# Patient Record
Sex: Male | Born: 1940 | Race: White | Hispanic: No | Smoking: Former smoker
Health system: Southern US, Community
[De-identification: ages and names within clinical notes are randomized; demographics above are authoritative.]

## PROBLEM LIST (undated history)

## (undated) DIAGNOSIS — Z86711 Personal history of pulmonary embolism: Secondary | ICD-10-CM

## (undated) DIAGNOSIS — S22089A Unspecified fracture of T11-T12 vertebra, initial encounter for closed fracture: Secondary | ICD-10-CM

## (undated) DIAGNOSIS — J441 Chronic obstructive pulmonary disease with (acute) exacerbation: Secondary | ICD-10-CM

## (undated) DIAGNOSIS — R590 Localized enlarged lymph nodes: Secondary | ICD-10-CM

## (undated) DIAGNOSIS — M545 Low back pain, unspecified: Secondary | ICD-10-CM

## (undated) DIAGNOSIS — F32A Depression, unspecified: Secondary | ICD-10-CM

## (undated) DIAGNOSIS — E785 Hyperlipidemia, unspecified: Secondary | ICD-10-CM

## (undated) DIAGNOSIS — D72829 Elevated white blood cell count, unspecified: Secondary | ICD-10-CM

## (undated) DIAGNOSIS — J9600 Acute respiratory failure, unspecified whether with hypoxia or hypercapnia: Secondary | ICD-10-CM

## (undated) DIAGNOSIS — N400 Enlarged prostate without lower urinary tract symptoms: Secondary | ICD-10-CM

## (undated) DIAGNOSIS — Z720 Tobacco use: Secondary | ICD-10-CM

## (undated) DIAGNOSIS — I10 Essential (primary) hypertension: Secondary | ICD-10-CM

## (undated) DIAGNOSIS — R9389 Abnormal findings on diagnostic imaging of other specified body structures: Secondary | ICD-10-CM

## (undated) DIAGNOSIS — N289 Disorder of kidney and ureter, unspecified: Secondary | ICD-10-CM

## (undated) DIAGNOSIS — F329 Major depressive disorder, single episode, unspecified: Secondary | ICD-10-CM

## (undated) HISTORY — PX: OTHER SURGICAL HISTORY: SHX169

## (undated) HISTORY — DX: Low back pain: M54.5

## (undated) HISTORY — PX: LUNG LOBECTOMY: SHX167

## (undated) HISTORY — DX: Elevated white blood cell count, unspecified: D72.829

## (undated) HISTORY — DX: Disorder of kidney and ureter, unspecified: N28.9

## (undated) HISTORY — DX: Depression, unspecified: F32.A

## (undated) HISTORY — DX: Personal history of pulmonary embolism: Z86.711

## (undated) HISTORY — DX: Acute respiratory failure, unspecified whether with hypoxia or hypercapnia: J96.00

## (undated) HISTORY — DX: Chronic obstructive pulmonary disease with (acute) exacerbation: J44.1

## (undated) HISTORY — DX: Unspecified fracture of t11-T12 vertebra, initial encounter for closed fracture: S22.089A

## (undated) HISTORY — DX: Hyperlipidemia, unspecified: E78.5

## (undated) HISTORY — DX: Tobacco use: Z72.0

## (undated) HISTORY — DX: Low back pain, unspecified: M54.50

## (undated) HISTORY — DX: Essential (primary) hypertension: I10

## (undated) HISTORY — DX: Localized enlarged lymph nodes: R59.0

## (undated) HISTORY — DX: Benign prostatic hyperplasia without lower urinary tract symptoms: N40.0

## (undated) HISTORY — DX: Major depressive disorder, single episode, unspecified: F32.9

## (undated) HISTORY — DX: Abnormal findings on diagnostic imaging of other specified body structures: R93.89

---

## 2016-03-09 LAB — BASIC METABOLIC PANEL
BUN: 49 mg/dL — AB (ref 4–21)
CREATININE: 1.2 mg/dL (ref 0.6–1.3)
Glucose: 136 mg/dL
SODIUM: 135 mmol/L — AB (ref 137–147)

## 2016-03-09 LAB — CBC AND DIFFERENTIAL
HCT: 58 % — AB (ref 41–53)
Hemoglobin: 20.3 g/dL — AB (ref 13.5–17.5)
PLATELETS: 83 10*3/uL — AB (ref 150–399)
WBC: 28 10*3/mL

## 2016-03-09 LAB — PROTIME-INR: Protime: 38.7 seconds — AB (ref 10.0–13.8)

## 2016-03-09 LAB — POCT INR: INR: 4 — AB (ref 0.9–1.1)

## 2016-03-11 LAB — POCT INR: INR: 2.5 — AB (ref 0.9–1.1)

## 2016-03-11 LAB — BASIC METABOLIC PANEL
BUN: 44 mg/dL — AB (ref 4–21)
CREATININE: 1.2 mg/dL (ref 0.6–1.3)
Potassium: 4.1 mmol/L (ref 3.4–5.3)
SODIUM: 134 mmol/L — AB (ref 137–147)

## 2016-03-11 LAB — CBC AND DIFFERENTIAL
HEMATOCRIT: 56 % — AB (ref 41–53)
Hemoglobin: 19.4 g/dL — AB (ref 13.5–17.5)
Platelets: 75 10*3/uL — AB (ref 150–399)
WBC: 25.4 10^3/mL

## 2016-03-11 LAB — PROTIME-INR: Protime: 26.8 seconds — AB (ref 10.0–13.8)

## 2016-03-11 LAB — HEPATIC FUNCTION PANEL
ALK PHOS: 81 U/L (ref 25–125)
ALT: 9 U/L — AB (ref 10–40)
AST: 14 U/L (ref 14–40)
Bilirubin, Total: 0.5 mg/dL

## 2016-03-12 ENCOUNTER — Non-Acute Institutional Stay (SKILLED_NURSING_FACILITY): Payer: Medicare Other | Admitting: Internal Medicine

## 2016-03-12 ENCOUNTER — Encounter: Payer: Self-pay | Admitting: Internal Medicine

## 2016-03-12 DIAGNOSIS — Z720 Tobacco use: Secondary | ICD-10-CM | POA: Diagnosis not present

## 2016-03-12 DIAGNOSIS — N189 Chronic kidney disease, unspecified: Secondary | ICD-10-CM

## 2016-03-12 DIAGNOSIS — J9601 Acute respiratory failure with hypoxia: Secondary | ICD-10-CM

## 2016-03-12 DIAGNOSIS — J9602 Acute respiratory failure with hypercapnia: Secondary | ICD-10-CM

## 2016-03-12 DIAGNOSIS — N401 Enlarged prostate with lower urinary tract symptoms: Secondary | ICD-10-CM | POA: Diagnosis not present

## 2016-03-12 DIAGNOSIS — J441 Chronic obstructive pulmonary disease with (acute) exacerbation: Secondary | ICD-10-CM | POA: Diagnosis not present

## 2016-03-12 DIAGNOSIS — N179 Acute kidney failure, unspecified: Secondary | ICD-10-CM

## 2016-03-12 DIAGNOSIS — R590 Localized enlarged lymph nodes: Secondary | ICD-10-CM

## 2016-03-12 DIAGNOSIS — E871 Hypo-osmolality and hyponatremia: Secondary | ICD-10-CM | POA: Diagnosis not present

## 2016-03-12 DIAGNOSIS — R599 Enlarged lymph nodes, unspecified: Secondary | ICD-10-CM | POA: Diagnosis not present

## 2016-03-12 DIAGNOSIS — S22080D Wedge compression fracture of T11-T12 vertebra, subsequent encounter for fracture with routine healing: Secondary | ICD-10-CM

## 2016-03-12 DIAGNOSIS — E785 Hyperlipidemia, unspecified: Secondary | ICD-10-CM | POA: Diagnosis not present

## 2016-03-12 DIAGNOSIS — Z86711 Personal history of pulmonary embolism: Secondary | ICD-10-CM

## 2016-03-12 DIAGNOSIS — N138 Other obstructive and reflux uropathy: Secondary | ICD-10-CM

## 2016-03-12 NOTE — Progress Notes (Signed)
MRN: [id number] Name: Jon Everett  Sex: male Age: 75 y.o. DOB: 06/01/1941  PSC #:  [facility] Facility/Room: 107 P Level Of Care: SNF Provider: Margit Hanks Emergency Contacts: No emergency contact information on file.  Code Status:   Allergies: Review of patient's allergies indicates no known allergies.  Chief Complaint  Patient presents with  . New Admit To SNF    HPI: Patient is 75 y.o. male who with COPD, CRF3, tob abuse and PE who was admitted to [facility] from 4/21-5/4 for acute respiratory failure 2/2 acute COPD exacderbation. Hospital course was complicated by acute on CKD, hyponatremia and urinary retention. During hospitalization pt was aslo found to have a LUL nodule with corresponding hilar lymphadenopathy and s T11 fracture treated with a brace. Pt ias admitrted to SNF for OT/PT. While at SNF pt will be followed for tob abuse, tx with nicotine patch and welbutrin, HLD, tx with zocor and h/o PE treated with coumadin which will be actively titrated.  Past Medical History  Diagnosis Date  . Acute respiratory failure (HCC)   . T11 vertebral fracture (HCC)   . Lower back pain   . 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 Leukocytosis   . Renal insufficiency   . History of pulmonary embolism   . Abnormal CT of the chest   . Depression   . HLD (hyperlipidemia)   . Tobacco use   . Hilar lymphadenopathy   . Essential hypertension   . BPH (benign prostatic hyperplasia)     Past Surgical History  Procedure Laterality Date  . Lung lobectomy    . Lc ivc filter placement        Medication List       This list is accurate as of: 03/12/16 11:59 PM.  Always use your most recent med list.               albuterol (2.5 MG/3ML) 0.083% nebulizer solution  Commonly known as:  PROVENTIL  Take 2.5 mg by nebulization every 2 (two) hours as needed for wheezing or shortness of breath. Inhale 2.5 mg by nebulization every 6 hours.      albuterol 0.63 MG/3ML nebulizer solution  Commonly known as:  ACCUNEB  Take 1 ampule by nebulization every 6 (six) hours as needed for wheezing.     BREO ELLIPTA 100-25 MCG/INH Aepb  Generic drug:  fluticasone furoate-vilanterol  Inhale 1 puff into the lungs daily.     buPROPion 150 MG 12 hr tablet  Commonly known as:  ZYBAN  Take 150 mg by mouth daily.     carvedilol 3.125 MG tablet  Commonly known as:  COREG  Take 3.125 mg by mouth 2 (two) times daily.     furosemide 20 MG tablet  Commonly known as:  LASIX  Take 20 mg by mouth daily.     ipratropium 0.02 % nebulizer solution  Commonly known as:  ATROVENT  Take 2.5 mLs by nebulization every 6 (six) hours.     nicotine 14 mg/24hr patch  Commonly known as:  NICODERM CQ - dosed in mg/24 hours  Place 14 mg onto the skin daily.     predniSONE 10 MG tablet  Commonly known as:  DELTASONE  Take 30 mg daily for 3 days, 20 mg daily for 3 days, 10 mg daily for 3 days, 5 mg daily 3 days, and then stop.     simethicone 80 MG chewable tablet  Commonly known as:  MYLICON  Chew 80 mg by mouth every 6 (six) hours as needed for flatulence.     simvastatin 20 MG tablet  Commonly known as:  ZOCOR  Take 20 mg by mouth daily.     tamsulosin 0.4 MG Caps capsule  Commonly known as:  FLOMAX  Take 0.4 mg by mouth at bedtime.     theophylline 200 MG 12 hr tablet  Commonly known as:  THEODUR  Take 200 mg by mouth 2 (two) times daily.     traMADol 50 MG tablet  Commonly known as:  ULTRAM  Take by mouth every 6 (six) hours as needed.     warfarin 2.5 MG tablet  Commonly known as:  COUMADIN  Take 2.5 mg by mouth daily. With evening meal.        No orders of the defined types were placed in this encounter.     There is no immunization history on file for this patient.  Social History  Substance Use Topics  . Smoking status: Current Every Day Smoker -- 0.50 packs/day    Types: Cigarettes  . Smokeless tobacco: Never Used  .  Alcohol Use: No    Family history is + CAm, HD f  Review of Systems  DATA OBTAINED: from patient, nurse GENERAL:  no fevers, fatigue, appetite changes SKIN: No itching, rash or wounds EYES: No eye pain, redness, discharge EARS: No earache, tinnitus, change in hearing NOSE: No congestion, drainage or bleeding  MOUTH/THROAT: No mouth or tooth pain, No sore throat RESPIRATORY: No cough, wheezing, SOB CARDIAC: No chest pain, palpitations, lower extremity edema  GI: No abdominal pain, No N/V/D or constipation, No heartburn or reflux  GU: No dysuria, frequency or urgency, or incontinence  MUSCULOSKELETAL: No unrelieved bone/joint pain NEUROLOGIC: No headache, dizziness or focal weakness PSYCHIATRIC: No c/o anxiety or sadness   Filed Vitals:   03/12/16 1250  BP: 93/65  Pulse: 90  Temp: 97 F (36.1 C)  Resp: 20    SpO2 Readings from Last 1 Encounters:  No data found for SpO2        Physical Exam  GENERAL APPEARANCE: Alert, conversant,  No acute distress.  SKIN: No diaphoresis rash HEAD: Normocephalic, atraumatic  EYES: Conjunctiva/lids clear. Pupils round, reactive. EOMs intact.  EARS: External exam WNL, canals clear. Hearing grossly normal.  NOSE: No deformity or discharge.  MOUTH/THROAT: Lips w/o lesions  RESPIRATORY: Breathing is even, unlabored. Lung sounds are diffusely decreased   CARDIOVASCULAR: Heart RRR no murmurs, rubs or gallops. No peripheral edema.   GASTROINTESTINAL: Abdomen is soft, non-tender, not distended w/ normal bowel sounds. GENITOURINARY: Bladder non tender, not distended; foley  MUSCULOSKELETAL: No abnormal joints or musculature NEUROLOGIC:  Cranial nerves 2-12 grossly intact. Moves all extremities  PSYCHIATRIC: Mood and affect appropriate to situation, no behavioral issues  Patient Active Problem List   Diagnosis Date Noted  . 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 Acute respiratory failure with  hypoxia and hypercapnia (HCC) 03/13/2016  . Acute kidney injury superimposed on CKD (HCC) 03/13/2016  . Hyponatremia 03/13/2016  . Hilar lymphadenopathy 03/13/2016  . T11 vertebral fracture (HCC) 03/13/2016  . History of pulmonary embolus (PE) 03/13/2016  . Tobacco abuse 03/13/2016  . BPH with urinary obstruction 03/13/2016  . HLD (hyperlipidemia) 03/13/2016    CBC    Component Value Date/Time   WBC 25.4 03/11/2016   HGB 19.4* 03/11/2016   HCT 56* 03/11/2016   PLT 75* 03/11/2016  CMP     Component Value Date/Time   NA 134* 03/11/2016   K 4.1 03/11/2016   BUN 44* 03/11/2016   CREATININE 1.2 03/11/2016   AST 14 03/11/2016   ALT 9* 03/11/2016   ALKPHOS 81 03/11/2016    No results found for: HGBA1C   Patient was never admitted.  Not all labs, radiology exams or other studies done during hospitalization come through on my EPIC note; however they are reviewed by me.    Assessment and Plan  Acute exacerbation of chronic obstructive pulmonary disease (COPD) (HCC) Treated with nebs,IV steroids, levaquin for 13 days; cultures have been negative and CXR is neg  Acute respiratory failure with hypoxia and hypercapnia (HCC) 2/2 COPD exacerbation; improved SNF - off O2 ; will monitor  Acute kidney injury superimposed on CKD (HCC) On Stage 3 CKD - Cr returned to baseline 1.2  SNF - will f/u BMP  Hyponatremia Returned to 135; SNF - will monitor with BMP  Hilar lymphadenopathy With L pulmonary nodule on chest CTA; needs f/u outpr PET scan;  SNF - f/u pulm in 1-2 weeks  T11 vertebral fracture (HCC) TLSO brace 24/7 for 6 weeks SNF - cont brace; f/u N'surg 2 weeks  History of pulmonary embolus (PE) INR was supratheraputic; when down to 2.5  Coumadin was resumed at 2.5 mg SNF - will be actively titrating coumadin dose  Tobacco abuse SNF - pt is now wearing a nicotine patch and is on wellbutrin as well  BPH with urinary obstruction SNF pt reported had to be cathed 3  times during hospitalization;per urology foley was laced , to be removed at urology f/u  HLD (hyperlipidemia) SNF - not stated as uncontrolled ; cont zocor 20 mg   Time spent . 45 min;> 50% of time with patient was spent reviewing records, labs, tests and studies, counseling and developing plan of care  Merrilee Seashore, MD

## 2016-03-13 ENCOUNTER — Encounter: Payer: Self-pay | Admitting: Internal Medicine

## 2016-03-13 DIAGNOSIS — J9602 Acute respiratory failure with hypercapnia: Secondary | ICD-10-CM

## 2016-03-13 DIAGNOSIS — R590 Localized enlarged lymph nodes: Secondary | ICD-10-CM | POA: Insufficient documentation

## 2016-03-13 DIAGNOSIS — Z86711 Personal history of pulmonary embolism: Secondary | ICD-10-CM | POA: Insufficient documentation

## 2016-03-13 DIAGNOSIS — J9601 Acute respiratory failure with hypoxia: Secondary | ICD-10-CM | POA: Insufficient documentation

## 2016-03-13 DIAGNOSIS — N138 Other obstructive and reflux uropathy: Secondary | ICD-10-CM | POA: Insufficient documentation

## 2016-03-13 DIAGNOSIS — Z720 Tobacco use: Secondary | ICD-10-CM | POA: Insufficient documentation

## 2016-03-13 DIAGNOSIS — E785 Hyperlipidemia, unspecified: Secondary | ICD-10-CM | POA: Insufficient documentation

## 2016-03-13 DIAGNOSIS — N189 Chronic kidney disease, unspecified: Secondary | ICD-10-CM

## 2016-03-13 DIAGNOSIS — E871 Hypo-osmolality and hyponatremia: Secondary | ICD-10-CM | POA: Insufficient documentation

## 2016-03-13 DIAGNOSIS — S22089A Unspecified fracture of T11-T12 vertebra, initial encounter for closed fracture: Secondary | ICD-10-CM | POA: Insufficient documentation

## 2016-03-13 DIAGNOSIS — N401 Enlarged prostate with lower urinary tract symptoms: Secondary | ICD-10-CM

## 2016-03-13 DIAGNOSIS — N179 Acute kidney failure, unspecified: Secondary | ICD-10-CM | POA: Insufficient documentation

## 2016-03-13 DIAGNOSIS — J441 Chronic obstructive pulmonary disease with (acute) exacerbation: Secondary | ICD-10-CM | POA: Insufficient documentation

## 2016-03-13 NOTE — Assessment & Plan Note (Signed)
With L pulmonary nodule on chest CTA; needs f/u outpr PET scan;  SNF - f/u pulm in 1-2 weeks

## 2016-03-13 NOTE — Assessment & Plan Note (Signed)
Returned to 135; SNF - will monitor with BMP

## 2016-03-13 NOTE — Assessment & Plan Note (Signed)
On Stage 3 CKD - Cr returned to baseline 1.2  SNF - will f/u BMP

## 2016-03-13 NOTE — Assessment & Plan Note (Signed)
INR was supratheraputic; when down to 2.5  Coumadin was resumed at 2.5 mg SNF - will be actively titrating coumadin dose

## 2016-03-13 NOTE — Assessment & Plan Note (Signed)
Treated with nebs,IV steroids, levaquin for 13 days; cultures have been negative and CXR is neg

## 2016-03-13 NOTE — Assessment & Plan Note (Signed)
SNF - not stated as uncontrolled ; cont zocor 20 mg

## 2016-03-13 NOTE — Assessment & Plan Note (Signed)
2/2 COPD exacerbation; improved SNF - off O2 ; will monitor

## 2016-03-13 NOTE — Assessment & Plan Note (Signed)
SNF pt reported had to be cathed 3 times during hospitalization;per urology foley was laced , to be removed at urology f/u

## 2016-03-13 NOTE — Assessment & Plan Note (Signed)
SNF - pt is now wearing a nicotine patch and is on wellbutrin as well

## 2016-03-13 NOTE — Assessment & Plan Note (Signed)
TLSO brace 24/7 for 6 weeks SNF - cont brace; f/u N'surg 2 weeks

## 2016-03-15 LAB — BASIC METABOLIC PANEL
BUN: 34 mg/dL — AB (ref 4–21)
CREATININE: 1.2 mg/dL (ref <=1.3)
Glucose: 120 mg/dL
POTASSIUM: 4.4 mmol/L (ref 3.4–5.3)
Sodium: 128 mmol/L — AB (ref 137–147)

## 2016-03-15 LAB — CBC AND DIFFERENTIAL
HCT: 54 % — AB (ref 41–53)
Hemoglobin: 17.9 g/dL — AB (ref 13.5–17.5)
Platelets: 95 10*3/uL — AB (ref 150–399)
WBC: 27.2 10^3/mL

## 2016-03-17 ENCOUNTER — Encounter: Payer: Self-pay | Admitting: Internal Medicine

## 2016-03-17 ENCOUNTER — Non-Acute Institutional Stay (SKILLED_NURSING_FACILITY): Payer: Medicare Other | Admitting: Internal Medicine

## 2016-03-17 DIAGNOSIS — E871 Hypo-osmolality and hyponatremia: Secondary | ICD-10-CM | POA: Diagnosis not present

## 2016-03-17 DIAGNOSIS — D72829 Elevated white blood cell count, unspecified: Secondary | ICD-10-CM

## 2016-03-17 DIAGNOSIS — D696 Thrombocytopenia, unspecified: Secondary | ICD-10-CM

## 2016-03-17 NOTE — Progress Notes (Signed)
MRN: [id number] Name: Jon Everett  Sex: male Age: 75 y.o. DOB: [birth year]  PSC #: [facility] Facility/Room: 107 - P Level Of Care: SNF Provider: Merrilee Seashore MD Emergency Contacts:Almanza, Renae Fickle (Son)  [phone] Code Status: FullCode   Allergies: Review of patient's allergies indicates no known allergies.  Chief Complaint  Patient presents with  . Acute Visit    HPI: Patient is 75 y.o. male who is being seen today acutely for abnormal lab results, discussed below. Pt feels fine , has no c/o, no cough ,cold CP , no more SOB than usual, has foley, no discomfort. Pt says he doesn't think he drink much fluid BUT also comments he can't believe how much urine comes out of him. Also told me he drank 3 pitchers of water the first night he was at [facility] when he had no TV.  Past Medical History  Diagnosis Date  . Acute respiratory failure (HCC)   . T11 vertebral fracture (HCC)   . Lower back pain   . 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 Leukocytosis   . Renal insufficiency   . History of pulmonary embolism   . Abnormal CT of the chest   . Depression   . HLD (hyperlipidemia)   . Tobacco use   . Hilar lymphadenopathy   . Essential hypertension   . BPH (benign prostatic hyperplasia)     Past Surgical History  Procedure Laterality Date  . Lung lobectomy    . Lc ivc filter placement        Medication List       This list is accurate as of: 03/17/16  9:20 PM.  Always use your most recent med list.               albuterol (2.5 MG/3ML) 0.083% nebulizer solution  Commonly known as:  PROVENTIL  Take 2.5 mg by nebulization every 2 (two) hours as needed for wheezing or shortness of breath. Inhale 2.5 mg by nebulization every 6 hours.     albuterol 0.63 MG/3ML nebulizer solution  Commonly known as:  ACCUNEB  Take 1 ampule by nebulization every 6 (six) hours as needed for wheezing.     BREO ELLIPTA 100-25 MCG/INH Aepb  Generic drug:   fluticasone furoate-vilanterol  Inhale 1 puff into the lungs daily.     buPROPion 150 MG 12 hr tablet  Commonly known as:  ZYBAN  Take 150 mg by mouth daily.     carvedilol 3.125 MG tablet  Commonly known as:  COREG  Take 3.125 mg by mouth 2 (two) times daily.     furosemide 20 MG tablet  Commonly known as:  LASIX  Take 20 mg by mouth daily.     ipratropium 0.02 % nebulizer solution  Commonly known as:  ATROVENT  Take 2.5 mLs by nebulization every 6 (six) hours.     nicotine 14 mg/24hr patch  Commonly known as:  NICODERM CQ - dosed in mg/24 hours  Place 14 mg onto the skin daily.     predniSONE 10 MG tablet  Commonly known as:  DELTASONE  Take 30 mg daily for 3 days, 20 mg daily for 3 days, 10 mg daily for 3 days, 5 mg daily 3 days, and then stop.     simethicone 80 MG chewable tablet  Commonly known as:  MYLICON  Chew 80 mg by mouth every 6 (six) hours as needed for flatulence.     simvastatin 20 MG tablet  Commonly known as:  ZOCOR  Take 20 mg by mouth daily.     tamsulosin 0.4 MG Caps capsule  Commonly known as:  FLOMAX  Take 0.4 mg by mouth at bedtime.     theophylline 200 MG 12 hr tablet  Commonly known as:  THEODUR  Take 200 mg by mouth 2 (two) times daily.     traMADol 50 MG tablet  Commonly known as:  ULTRAM  Take by mouth every 6 (six) hours as needed.     warfarin 2.5 MG tablet  Commonly known as:  COUMADIN  Take 2.5 mg by mouth daily. With evening meal.        No orders of the defined types were placed in this encounter.     There is no immunization history on file for this patient.  Social History  Substance Use Topics  . Smoking status: Former Smoker -- 0.50 packs/day    Types: Cigarettes  . Smokeless tobacco: Never Used  . Alcohol Use: No    Review of Systems  DATA OBTAINED: from patient GENERAL:  no fevers, fatigue, appetite changes SKIN: No itching, rash HEENT: No complaint RESPIRATORY: No cough, wheezing, no more SOB than  usual CARDIAC: No chest pain, palpitations, lower extremity edema  GI: No abdominal pain, No N/V/D or constipation, No heartburn or reflux  GU: No dysuria, frequency or urgency, or incontinence  MUSCULOSKELETAL: back pain is better NEUROLOGIC: No headache, dizziness  PSYCHIATRIC: No overt anxiety or sadness  Filed Vitals:   03/17/16 1100  BP: 94/68  Pulse: 78  Temp: 97.9 F (36.6 C)  Resp: 20    Physical Exam  GENERAL APPEARANCE: Alert, conversant, No acute distress  SKIN: No diaphoresis rash HEENT: Unremarkable RESPIRATORY: Breathing is even, unlabored. Lung sounds are with rhonch/squeaks L upper chest; wearing O2   CARDIOVASCULAR: Heart RRR no murmurs, rubs or gallops. No peripheral edema  GASTROINTESTINAL: Abdomen is soft, non-tender, not distended w/ normal bowel sounds.  GENITOURINARY: Bladder non tender, not distended  MUSCULOSKELETAL: No abnormal joints or musculature NEUROLOGIC: Cranial nerves 2-12 grossly intact. Moves all extremities PSYCHIATRIC: Mood and affect appropriate to situation, no behavioral issues  Patient Active Problem List   Diagnosis Date Noted  . 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 Acute respiratory failure with hypoxia and hypercapnia (HCC) 03/13/2016  . Acute kidney injury superimposed on CKD (HCC) 03/13/2016  . Hyponatremia 03/13/2016  . Hilar lymphadenopathy 03/13/2016  . T11 vertebral fracture (HCC) 03/13/2016  . History of pulmonary embolus (PE) 03/13/2016  . Tobacco abuse 03/13/2016  . BPH with urinary obstruction 03/13/2016  . HLD (hyperlipidemia) 03/13/2016    CBC    Component Value Date/Time   WBC 27.2 03/15/2016   HGB 17.9* 03/15/2016   HCT 54* 03/15/2016   PLT 95* 03/15/2016    CMP     Component Value Date/Time   NA 128* 03/15/2016   K 4.4 03/15/2016   BUN 34* 03/15/2016   CREATININE 1.2 03/15/2016   AST 14 03/11/2016   ALT 9* 03/11/2016   ALKPHOS 81 03/11/2016     Assessment and Plan  LEUKOCYTOSIS - pt's WBC was 27.2 - review [facility], no source of infection found; will check pt's urine but I do not think this pt has an infection, in every way he is doing better than prior.  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   Time spent > 35 min;> 50% of time with patient was spent reviewing records, labs, tests and studies, counseling and developing plan of care Merrilee Seashore  MD

## 2016-03-18 ENCOUNTER — Encounter: Payer: Self-pay | Admitting: Internal Medicine

## 2016-03-18 ENCOUNTER — Encounter (SKILLED_NURSING_FACILITY): Payer: Medicare Other | Admitting: Internal Medicine

## 2016-03-18 DIAGNOSIS — D72829 Elevated white blood cell count, unspecified: Secondary | ICD-10-CM | POA: Diagnosis not present

## 2016-03-18 DIAGNOSIS — R319 Hematuria, unspecified: Secondary | ICD-10-CM

## 2016-03-18 NOTE — Progress Notes (Signed)
Patient ID: Jon Everett, male   DOB: 05-Sep-1941, 75 y.o.   MRN: [id number]  Location:  [facility] Room Number: 107/P Place of Service:  SNF [phone]) Provider:  Edmon Crape  No primary care provider on file.  No care team member to display  No emergency contact information on file.  Code Status:  107/P Goals of care: Advanced Directive information Advanced Directives 03/18/2016  Does patient have an advance directive? Yes  Does patient want to make changes to advanced directive? No - Patient declined  Copy of advanced directive(s) in chart? Yes  Would patient like information on creating an advanced directive? -    Chief complaint-acute visit secondary to leukocytosis-hematuria   HPI:  Pt is a 75 y.o. male seen today for an acute visit for follow-up leukocytosis-patient also has developed hematuria.  Patient has a chronic indwelling Foley catheter that is draining some bloody urine apparently there there is a bit less later today than it was this morning.  He denies any fever or chills a urinalysis and culture is pending which was drawn yesterday patient does have a history of leukocytosis and appears this was somewhat persisted in the hospital running often in the 20,000+ range-most recent labs showed white count of 27,200 on May 8.  He continues to deny any shortness of breath chest congestion cough fever or chills.  Patient was hospitalized for acute respiratory failure-he also had hyponatremia also discovered to have a left upper lung nodule.  In regards to hyponatremia Dr. Lyn Hollingshead has seen him and put him on fluid restrictions 1500 mL a day-updated BMP is pending for May 15.  Clinically he appears stable as noted above vital signs are stable        Past Medical History  Diagnosis Date  . Acute respiratory failure (HCC)   . T11 vertebral fracture (HCC)   . Lower back pain   . 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 Leukocytosis   . Renal insufficiency   . History of pulmonary embolism   . Abnormal CT of the chest   . Depression   . HLD (hyperlipidemia)   . Tobacco use   . Hilar lymphadenopathy   . Essential hypertension   . BPH (benign prostatic hyperplasia)    Past Surgical History  Procedure Laterality Date  . Lung lobectomy    . Lc ivc filter placement      No Known Allergies    Medication List       This list is accurate as of: 03/18/16  1:44 PM.  Always use your most recent med list.               albuterol (2.5 MG/3ML) 0.083% nebulizer solution  Commonly known as:  PROVENTIL  Take 2.5 mg by nebulization every 2 (two) hours as needed for wheezing or shortness of breath.     albuterol 0.63 MG/3ML nebulizer solution  Commonly known as:  ACCUNEB  Take 1 ampule by nebulization every 6 (six) hours as needed for wheezing.     BREO ELLIPTA 100-25 MCG/INH Aepb  Generic drug:  fluticasone furoate-vilanterol  Inhale 1 puff once daily for COPD rinse mouth after meds and spit out     buPROPion 150 MG 12 hr tablet  Commonly known as:  ZYBAN  Take 150 mg by mouth daily. For Depression     carvedilol 3.125 MG tablet  Commonly known as:  COREG  Take 3.125  mg by mouth 2 (two) times daily. For HTN     furosemide 20 MG tablet  Commonly known as:  LASIX  Take 20 mg by mouth daily. For HTN     ipratropium 0.02 % nebulizer solution  Commonly known as:  ATROVENT  Take 2.5 mLs by nebulization every 6 (six) hours. For COPD     nicotine 14 mg/24hr patch  Commonly known as:  NICODERM CQ - dosed in mg/24 hours  Place 1 patch on skin daily for tobacco abuse remove old patch before applying new one     predniSONE 10 MG tablet  Commonly known as:  DELTASONE  Give 10 mg by mouth for 3 days ( start date 03/18/16 end date 5/13/ 17 ) Then give 5 mg by mouth for 3 days and then stop     simethicone 80 MG chewable tablet  Commonly known as:  MYLICON  Chew 80 mg by  mouth every 6 (six) hours as needed for flatulence.     simvastatin 20 MG tablet  Commonly known as:  ZOCOR  Take 20 mg by mouth daily. For hyperlipidemia     tamsulosin 0.4 MG Caps capsule  Commonly known as:  FLOMAX  Take 0.4 mg by mouth at bedtime. For BPH     theophylline 200 MG 12 hr tablet  Commonly known as:  THEODUR  Take 200 mg by mouth 2 (two) times daily. For COPD     traMADol 50 MG tablet  Commonly known as:  ULTRAM  Take by mouth every 6 (six) hours as needed.     warfarin 2.5 MG tablet  Commonly known as:  COUMADIN  Take 2.5 mg by mouth daily. With evening meal.        Review of Systems   General no complaints of fever chills.  Skin does not complain of rashes or itching.  Head ears eyes nose mouth and throat no complaints of sore throat nasal discharge or visual changes.  Respiratory does not complain of cough or shortness of breath does have a history COPD oxygen dependent.  Rx not complaining of any chest pain.  GI and no abdominal pain nausea vomiting diarrhea or constipation noted.  GU chronic indwelling Foley catheter with history of urinary retention.  Does not really complain of dysuria however.  Muscle skeletal is not complaining of joint pain.  Neurologic is not complaining of dizziness headache or syncopal-type feelings.  Psych does not exhibit concerns about overt depression or anxiety  Immunization History  Administered Date(s) Administered  . Influenza-Unspecified 09/09/2015  . PPD Test 03/11/2016  . Pneumococcal-Unspecified 09/09/2015   Pertinent  Health Maintenance Due  Topic Date Due  . PNA vac Low Risk Adult (1 of 2 - PCV13) 08/26/2008  . COLONOSCOPY  03/18/2026 (Originally 08/26/1993)  . INFLUENZA VACCINE  06/08/2016   No flowsheet data found. Functional Status Survey:    Filed Vitals:   03/18/16 1103  BP: 102/72  Pulse: 80  Temp: 97.6 F (36.4 C)  TempSrc: Oral  Resp: 20  Height: 5\' 11"  (1.803 m)  Weight: 213  lb (96.616 kg)   Body mass index is 29.72 kg/(m^2). Physical Exam   In general this is a pleasant elderly male in no distress sitting comfortably in his chair.  His skin is warm and dry he has numerous solar induced changes.  Oropharynx clear mucous membranes moist.  Chest is clear to auscultation with severely reduced breath sounds no labored breathing.  Heart is regular rate and  rhythm without murmur gallop or rub he has minimal lower extremity edema.  Abdomen is soft nontender positive bowel sounds.  GU has a Foley catheter draining what appears to be some blood-tinged urine apparently this is [employer] and it appeared earlier today per nursing.  Musculoskeletal does move all extremities 4 with generalized weakness most prominently lower extremities.  Neurologic is grossly intact no lateralizing findings speech is clear.  Psych he is alert and oriented pleasant and appropriate  Labs reviewed:  Recent Labs  03/09/16 03/11/16 03/15/16  NA 135* 134* 128*  K  --  4.1 4.4  BUN 49* 44* 34*  CREATININE 1.2 1.2 1.2    Recent Labs  03/11/16  AST 14  ALT 9*  ALKPHOS 81    Recent Labs  03/09/16 03/11/16 03/15/16  WBC 28.0 25.4 27.2  HGB 20.3* 19.4* 17.9*  HCT 58* 56* 54*  PLT 83* 75* 95*   No results found for: TSH No results found for: HGBA1C No results found for: CHOL, HDL, LDLCALC, LDLDIRECT, TRIG, CHOLHDL  Significant Diagnostic Results in last 30 days:  No results found.   Assessment and plan.  #1 leukocytosis-he does not really show signs of sepsis in any fashion without cough congestion fever chills has hematuria a urine culture is pending we'll await those results and monitor-I did discuss this with Dr. Lyn HollingsheadAlexander via phone--ray  Also will order a hematology oncology consult for follow-up of leukocytosis-again patient does have a left upper lung nodule which may be contributing to this-.  #2 thrombocytopenia this appears relatively stable with a  platelet count 95,000 on lab May 8 with actually appears to be a slight improvement from hospital values.  #3 hematuria-as noted above a urine culture is pending  #4 hyponatremia again update lab is pending he has been put on fluid restrictions apparently had significant water intake previously per previous discussion and note I see by Dr. Lyn HollingsheadAlexander.  Clinically again he appears to be stable but will have to be monitored.  [id number]-of note greater than 25 minutes spent assessing patient-reviewing his chart-discussing status with nursing as well as with Dr. Lyn HollingsheadAlexander via phone-and coordinating and formulating a plan of care-of note greater than 50% of time spent coordinating plan of care     London SheerLuster, Sally C, [place] [phone]

## 2016-03-18 NOTE — Progress Notes (Signed)
This encounter was created in error - please disregard.  This encounter was created in error - please disregard.

## 2016-03-18 NOTE — Progress Notes (Deleted)
Patient ID: Jon LawsDonald Palazzo, male   DOB: 04-14-1941, 75 y.o.   MRN: [id number]

## 2016-03-24 LAB — BASIC METABOLIC PANEL
BUN: 20 mg/dL (ref 4–21)
Creatinine: 0.9 mg/dL (ref <=1.3)
GLUCOSE: 117 mg/dL
Potassium: 3.3 mmol/L — AB (ref 3.4–5.3)
Sodium: 130 mmol/L — AB (ref 137–147)

## 2016-03-25 ENCOUNTER — Non-Acute Institutional Stay (SKILLED_NURSING_FACILITY): Payer: Medicare Other | Admitting: Internal Medicine

## 2016-03-25 ENCOUNTER — Encounter: Payer: Self-pay | Admitting: Internal Medicine

## 2016-03-25 DIAGNOSIS — J9602 Acute respiratory failure with hypercapnia: Secondary | ICD-10-CM

## 2016-03-25 DIAGNOSIS — N138 Other obstructive and reflux uropathy: Secondary | ICD-10-CM

## 2016-03-25 DIAGNOSIS — Z86711 Personal history of pulmonary embolism: Secondary | ICD-10-CM | POA: Diagnosis not present

## 2016-03-25 DIAGNOSIS — N401 Enlarged prostate with lower urinary tract symptoms: Secondary | ICD-10-CM | POA: Diagnosis not present

## 2016-03-25 DIAGNOSIS — J9601 Acute respiratory failure with hypoxia: Secondary | ICD-10-CM

## 2016-03-25 DIAGNOSIS — N189 Chronic kidney disease, unspecified: Secondary | ICD-10-CM

## 2016-03-25 DIAGNOSIS — N179 Acute kidney failure, unspecified: Secondary | ICD-10-CM

## 2016-03-25 NOTE — Progress Notes (Signed)
Location:  [facility] Room Number: 107/P Place of Service:  SNF [phone]) Provider:  Edmon Crape  No primary care provider on file.  No care team member to display  No emergency contact information on file.  Code Status:  Full Code Goals of care: Advanced Directive information Advanced Directives 03/25/2016  Does patient have an advance directive? Yes  Does patient want to make changes to advanced directive? No - Patient declined  Copy of advanced directive(s) in chart? Yes     Chief Complaint  Patient presents with  . Discharge Note    HPI:  Pt is a 75 y.o. male seen today for For discharge-.  Patient was admitted to the hospital in late April for acute respiratory failure secondary to acute COPD exacerbation.  Hospital course was complicated by chronic kidney disease-hyponatremia and urinary retention.  He was also found to have a left upper lobe nodule with corresponding hilar lymphadenopathy and a T11 fracture that was treated with a brace.  Patient was admitted here for strengthening OT and PT he has gained strength was still will need fairly extensive PT and OT.  He is highly motivated to go home apparently wanted to go home today after he arrived-he did agree to stay for some rehabilitation and appears to have benefited fitted from this but again will need fairly aggressive therapy as an outpatient he does use a walker but still this is somewhat guarded and he needs assistance-patient assures me he does have very supportive family who will be with him constantly.  In regards to a T11 compression fracture this appears to be stable current pain medicines including tramadol-he did have an MRI this week apparently which appeared to show stability and told he did not really need follow-up.  In regards to respiratory issues this is been stable he is on chronic oxygen and is on numerous medications including Proventil albuterol nebulizers  Breo-Ellipta t-Atrovent nebulizers-he has completed a prednisone taper as well-of note he is also on theophylline 2 times a day.  Patient has been noted to have a low sodium in the high 120s Dr. Lyn Hollingshead did put him on fluid restriction since appears to have helped sodium on lab done yesterday May 17 was 1:30 renal function appear to be stable with a creatinine of 0.86 and BUN 20.  Marland Kitchen  Patient does have a history of leukocytosis with a white count of 27,200 on lab done May 8-this was also elevated hospital--infection was found in the hospital-clinically appears to be doing well there is some thought this may be related to a process involving the left upper lung nodule-hematology oncology consult has been ordered this will need follow-up as an outpatient he has been afebrile appears to be doing well in this regard again with no further sign of infection.  Of note he did have a urine culture done which did grow out 75,000 colonies of these and Dr. Lyn Hollingshead did start him on a course of Diflucan-.  Currently has no complaints is looking forward to going home vital signs of been stable.  He is on Coumadin for history of pulmonary embolism PT/INR has been scheduled for tomorrow.       Past Medical History  Diagnosis Date  . Acute respiratory failure (HCC)   . T11 vertebral fracture (HCC)   . Lower back pain   . 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 Leukocytosis   . Renal insufficiency   .  History of pulmonary embolism   . Abnormal CT of the chest   . Depression   . HLD (hyperlipidemia)   . Tobacco use   . Hilar lymphadenopathy   . Essential hypertension   . BPH (benign prostatic hyperplasia)    Past Surgical History  Procedure Laterality Date  . Lung lobectomy    . Lc ivc filter placement      No Known Allergies    Medication List       This list is accurate as of: 03/25/16  3:43 PM.  Always use your most recent med list.                BREO ELLIPTA 100-25 MCG/INH Aepb  Generic drug:  fluticasone furoate-vilanterol  Inhale 1 puff once daily for COPD rinse mouth after meds and spit out     buPROPion 150 MG 12 hr tablet  Commonly known as:  ZYBAN  Take 150 mg by mouth daily. For Depression     carvedilol 3.125 MG tablet  Commonly known as:  COREG  Take 3.125 mg by mouth 2 (two) times daily. For HTN     furosemide 20 MG tablet  Commonly known as:  LASIX  Take 20 mg by mouth daily. For HTN     ipratropium 0.02 % nebulizer solution  Commonly known as:  ATROVENT  Take 2.5 mLs by nebulization every 6 (six) hours. For COPD     nicotine 14 mg/24hr patch  Commonly known as:  NICODERM CQ - dosed in mg/24 hours  Place 1 patch on skin daily for tobacco abuse remove old patch before applying new one     simethicone 80 MG chewable tablet  Commonly known as:  MYLICON  Chew 80 mg by mouth every 6 (six) hours as needed for flatulence.     simvastatin 20 MG tablet  Commonly known as:  ZOCOR  Take 20 mg by mouth daily. For hyperlipidemia     tamsulosin 0.4 MG Caps capsule  Commonly known as:  FLOMAX  Take 0.4 mg by mouth at bedtime. For BPH     theophylline 200 MG 12 hr tablet  Commonly known as:  THEODUR  Take 200 mg by mouth 2 (two) times daily. For COPD     traMADol 50 MG tablet  Commonly known as:  ULTRAM  Take by mouth every 6 (six) hours as needed.     warfarin 2.5 MG tablet  Commonly known as:  COUMADIN  Take 2.5 mg by mouth daily. With evening meal.        Review of Systems  In general does not complain of fever or chills says he feels stronger.  Skin does not complain of rashes or itching.  Head ears eyes nose mouth and throat does not complain of visual changes sore throat or nasal discharge.  Respiratory does not complain of increase cough or shortness of breath beyond baseline.  Cardiac does not complain of chest pain do not really see significant lower extremity edema.  GI is not  complaining of nausea vomiting diarrhea constipation or abdominal discomfort.  GU history of urinary retention -- he has had his Foley removed and has urinated today he is followed by urology  Musculoskeletal moves all extremities 4 still has lower extremity weakness back pain appears to be controlled with the tramadol-he did ambulate with therapy today still somewhat weak with a walker he will need continued PT and OT  Neurologic does not complain of headache dizziness or numbness.  Psych does not complain of anxiety or depression appears to be in good spirits looking forward to going home  Immunization History  Administered Date(s) Administered  . Influenza-Unspecified 09/09/2015  . PPD Test 03/11/2016  . Pneumococcal-Unspecified 09/09/2015   Pertinent  Health Maintenance Due  Topic Date Due  . COLONOSCOPY  03/18/2026 (Originally 08/26/1993)  . INFLUENZA VACCINE  06/08/2016  . PNA vac Low Risk Adult (2 of 2 - PCV13) 09/08/2016   No flowsheet data found. Functional Status Survey:    Filed Vitals:   03/25/16 1541  BP: 130/74  Pulse: 64  Temp: 98.1 F (36.7 C)  TempSrc: Oral  Resp: 18  Height: 5\' 11"  (1.803 m)  Weight: 213 lb (96.616 kg)  SpO2: 94%   Body mass index is 29.72 kg/(m^2). Physical Exam   In general this is a pleasant LE male in no distress sitting in his wheelchair comfortably.  Skin is warm and dry.  Eyes pupils appear reactive to light sclera is injected are clear.  Oropharynx is clear mucous membranes moist.  Chest is clear to auscultation with reduced air entry there is no labored breathing.  He is wearing oxygen.  Heart is regular rate and rhythm somewhat distant heart sounds she does not really have significant peripheral edema.  Abdomen is somewhat obese soft nontender with positive bowel sounds.  GU-I could not appreciate any suprapubic tenderness or distention.  Musculoskeletal is able to move all extremities 4 again he is able to  ambulate with a walker but is quite weak he does have a back brace applied-.  Neurologic is grossly intact no lateralizing findings her speech is clear.  Psych he is alert and oriented pleasant and appropriate  Labs reviewed:  Recent Labs  03/11/16 03/15/16 03/24/16  NA 134* 128* 130*  K 4.1 4.4 3.3*  BUN 44* 34* 20  CREATININE 1.2 1.2 0.9    Recent Labs  03/11/16  AST 14  ALT 9*  ALKPHOS 81    Recent Labs  03/09/16 03/11/16 03/15/16  WBC 28.0 25.4 27.2  HGB 20.3* 19.4* 17.9*  HCT 58* 56* 54*  PLT 83* 75* 95*       Significant Diagnostic Results in last 30 days:  No results found.  Assessment/Plan  #1-COPD exasperation-is receiving the hospital and nebulizers IV steroids and antibiotics-e has completed prednisone taper-at this point appears stable he is oxygen dependent again continues on extensive nebulizers and inhalers as well as theophylline as noted above.  #2-history of acute kidney injury this appears stable--creatinine of 0.8 BUN of 20 on lab done yesterday-this will need follow-up by primary care provider.  #3 history of hyponatremia sodium of 1:30 on lab done yesterday shows improvement again this will need monitoring by primary care provider as well.  #4 history of hilar lymphadenopathy with left pulmonary nodule on chest CT-he will need follow-up by oncology and pulmonology.  #5 history of T11 vertebral fracture he does have a brace-recent MRI apparently showed stability he is followed by neurosurgery.  #6 history of pulmonary embolus he is on Coumadin chronically update INR is pending for tomorrow this was also will have to be updated by home health early next week currently on 2.5 mg a day.  Clinically this appears stable.  #7 history of tobacco abuse he has a nicotine patch he continues on Wellbutrin as well as.  #8 history BPH with urinary obstruction-again his catheters been removed he has voided which is encouraging this will need follow-up by  urology he  has been followed by urology.  #9 history of hyperlipidemia he is on Zocor 20 mg a day since his stay here was relatively short was not aggressively pursuing a lipid panel will need follow-up by primary care provider.  #10-history of leukocytosis-as noted above he will need hematology oncology follow-up does not show any signs of infection at this time.  #11 history ofYeast --75000 colonies --on urine culture been started on Diflucan for an extended course-INR will have to be monitored closely bowel he is on Diflucan-.  #12 mild hypokalemia with potassium of 3.4 on lab done yesterday Will give 20 mEq potassium tonight and continue 10 mEq a day thereafter will need a CBC and BMP updated early next week by home health primary care provider notified of results    [phone] note greater than 30 minutes spent on this discharge summary-greater than 50% of time spent coordinating plan of care for numerous diagnoses-prescriptions have been written .      London Sheer, [place] [phone]

## 2016-06-22 ENCOUNTER — Other Ambulatory Visit: Payer: Self-pay | Admitting: Internal Medicine

## 2016-08-29 ENCOUNTER — Emergency Department (HOSPITAL_COMMUNITY)
Admission: EM | Admit: 2016-08-29 | Discharge: 2016-08-29 | Disposition: A | Discharge status: Home / Self Care | Payer: Medicare Other | Attending: Emergency Medicine | Admitting: Emergency Medicine

## 2016-08-29 ENCOUNTER — Emergency Department (HOSPITAL_COMMUNITY): Payer: Medicare Other

## 2016-08-29 ENCOUNTER — Encounter (HOSPITAL_COMMUNITY): Payer: Self-pay

## 2016-08-29 DIAGNOSIS — S0990XA Unspecified injury of head, initial encounter: Secondary | ICD-10-CM

## 2016-08-29 DIAGNOSIS — Z87891 Personal history of nicotine dependence: Secondary | ICD-10-CM | POA: Insufficient documentation

## 2016-08-29 DIAGNOSIS — Z7901 Long term (current) use of anticoagulants: Secondary | ICD-10-CM | POA: Insufficient documentation

## 2016-08-29 DIAGNOSIS — Y9301 Activity, walking, marching and hiking: Secondary | ICD-10-CM | POA: Diagnosis not present

## 2016-08-29 DIAGNOSIS — I10 Essential (primary) hypertension: Secondary | ICD-10-CM | POA: Insufficient documentation

## 2016-08-29 DIAGNOSIS — Z79899 Other long term (current) drug therapy: Secondary | ICD-10-CM | POA: Diagnosis not present

## 2016-08-29 DIAGNOSIS — S0101XA Laceration without foreign body of scalp, initial encounter: Secondary | ICD-10-CM | POA: Insufficient documentation

## 2016-08-29 DIAGNOSIS — Y999 Unspecified external cause status: Secondary | ICD-10-CM | POA: Insufficient documentation

## 2016-08-29 DIAGNOSIS — Y9289 Other specified places as the place of occurrence of the external cause: Secondary | ICD-10-CM | POA: Diagnosis not present

## 2016-08-29 DIAGNOSIS — W228XXA Striking against or struck by other objects, initial encounter: Secondary | ICD-10-CM | POA: Diagnosis not present

## 2016-08-29 DIAGNOSIS — S060X1A Concussion with loss of consciousness of 30 minutes or less, initial encounter: Secondary | ICD-10-CM | POA: Insufficient documentation

## 2016-08-29 DIAGNOSIS — S0003XA Contusion of scalp, initial encounter: Secondary | ICD-10-CM

## 2016-08-29 LAB — CBC WITH DIFFERENTIAL/PLATELET
Basophils Absolute: 0.1 10*3/uL (ref 0.0–0.1)
Basophils Relative: 0 %
Eosinophils Absolute: 0.2 10*3/uL (ref 0.0–0.7)
Eosinophils Relative: 2 %
HEMATOCRIT: 34.9 % — AB (ref 39.0–52.0)
HEMOGLOBIN: 10.8 g/dL — AB (ref 13.0–17.0)
LYMPHS ABS: 2.6 10*3/uL (ref 0.7–4.0)
LYMPHS PCT: 23 %
MCH: 28.1 pg (ref 26.0–34.0)
MCHC: 30.9 g/dL (ref 30.0–36.0)
MCV: 90.9 fL (ref 78.0–100.0)
MONOS PCT: 9 %
Monocytes Absolute: 1.1 10*3/uL — ABNORMAL HIGH (ref 0.1–1.0)
NEUTROS ABS: 7.6 10*3/uL (ref 1.7–7.7)
NEUTROS PCT: 66 %
Platelets: 197 10*3/uL (ref 150–400)
RBC: 3.84 MIL/uL — AB (ref 4.22–5.81)
RDW: 17.4 % — ABNORMAL HIGH (ref 11.5–15.5)
WBC: 11.6 10*3/uL — AB (ref 4.0–10.5)

## 2016-08-29 LAB — BASIC METABOLIC PANEL
ANION GAP: 4 — AB (ref 5–15)
BUN: 9 mg/dL (ref 6–20)
CALCIUM: 9.4 mg/dL (ref 8.9–10.3)
CHLORIDE: 105 mmol/L (ref 101–111)
CO2: 32 mmol/L (ref 22–32)
Creatinine, Ser: 0.84 mg/dL (ref 0.61–1.24)
GFR calc non Af Amer: >60 mL/min (ref >60)
GLUCOSE: 107 mg/dL — AB (ref 65–99)
POTASSIUM: 3.7 mmol/L (ref 3.5–5.1)
Sodium: 141 mmol/L (ref 135–145)

## 2016-08-29 LAB — PROTIME-INR
INR: 1.57
PROTHROMBIN TIME: 18.9 s — AB (ref 11.4–15.2)

## 2016-08-29 MED ORDER — ONDANSETRON 4 MG PO TBDP
4.0000 mg | ORAL_TABLET | Freq: Once | ORAL | Status: AC
  Administered 2016-08-29: 4 mg via ORAL
  Filled 2016-08-29: qty 1

## 2016-08-29 MED ORDER — ONDANSETRON HCL 4 MG PO TABS: 4.0000 mg | ORAL_TABLET | Freq: Four times a day (QID) | ORAL | 0 refills | Status: AC | PRN

## 2016-08-29 MED ORDER — ACETAMINOPHEN 500 MG PO TABS: 1000.0000 mg | ORAL_TABLET | Freq: Three times a day (TID) | ORAL | 0 refills | Status: AC

## 2016-08-29 NOTE — ED Notes (Signed)
Pt sipping on gingerale tolerating well

## 2016-08-29 NOTE — ED Notes (Signed)
Pt back from CT placed back on monitor still c/o feeling cold has approx 6 blankets on him

## 2016-08-29 NOTE — ED Notes (Signed)
Pt ambulated in hallway c/o feeling dizzy though noted to have steady gate able to ambulate independently pt brought back to room and placed on monitor pt remains sitting up in the bed

## 2016-08-29 NOTE — ED Provider Notes (Signed)
[facility]-EMERGENCY DEPT Provider Note   CSN: [id number] Arrival date & time:        History   Chief Complaint Chief Complaint  Patient presents with  . Fall    fall at home hit the back of a metal grate with his head no LOC though pt unsure of events just prior to fall     HPI Jon Everett is a 75 y.o. male.  HPI  Patient presents by EMS after mechanical fall from standing while trying to walk up a flight of stairs. Per report patient uses a walker and tried to walk up a single flight of stairs and lost his balance and fell backwards hitting his head. Positive LOC and amnesia to the event. Patient complaining of headache. Patient also has nausea with multiple episodes of vomiting in route. He remained hemodynamically stable. Denies any other physical complaints at this time.  Past Medical History:  Diagnosis Date  . Abnormal CT of the chest   . 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 Acute respiratory failure (HCC)   . BPH (benign prostatic hyperplasia)   . Depression   . Essential hypertension   . Hilar lymphadenopathy   . History of pulmonary embolism   . HLD (hyperlipidemia)   . Leukocytosis   . Lower back pain   . Renal insufficiency   . T11 vertebral fracture (HCC)   . Tobacco use     Patient Active Problem List   Diagnosis Date Noted  . Erroneous encounter - disregard 03/21/2016  . Leukocytosis 03/17/2016  . Thrombocytopenia (HCC) 03/17/2016  . 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 Acute respiratory failure with hypoxia and hypercapnia (HCC) 03/13/2016  . Acute kidney injury superimposed on CKD (HCC) 03/13/2016  . Hyponatremia 03/13/2016  . Hilar lymphadenopathy 03/13/2016  . T11 vertebral fracture (HCC) 03/13/2016  . History of pulmonary embolus (PE) 03/13/2016  . Tobacco abuse 03/13/2016  . BPH with urinary obstruction 03/13/2016  . HLD (hyperlipidemia) 03/13/2016     Past Surgical History:  Procedure Laterality Date  . lc ivc filter placement    . LUNG LOBECTOMY         Home Medications    Prior to Admission medications   Medication Sig Start Date End Date Taking? Authorizing Provider  buPROPion (ZYBAN) 150 MG 12 hr tablet Take 150 mg by mouth daily. For Depression   Yes Historical Provider, MD  carvedilol (COREG) 3.125 MG tablet Take 3.125 mg by mouth 2 (two) times daily. For HTN   Yes Historical Provider, MD  fluticasone furoate-vilanterol (BREO ELLIPTA) 100-25 MCG/INH AEPB Inhale 1 puff once daily for COPD rinse mouth after meds and spit out   Yes Historical Provider, MD  furosemide (LASIX) 20 MG tablet Take 20 mg by mouth daily. For HTN   Yes Historical Provider, MD  ipratropium (ATROVENT) 0.02 % nebulizer solution Take 2.5 mLs by nebulization every 6 (six) hours. For COPD   Yes Historical Provider, MD  nicotine (NICODERM CQ - DOSED IN MG/24 HOURS) 14 mg/24hr patch Place 1 patch on skin daily for tobacco abuse remove old patch before applying new one   Yes Historical Provider, MD  simethicone (MYLICON) 80 MG chewable tablet Chew 80 mg by mouth every 6 (six) hours as needed for flatulence.   Yes Historical Provider, MD  simvastatin (ZOCOR) 20 MG tablet Take 20 mg by mouth daily. For hyperlipidemia   Yes Historical Provider, MD  tamsulosin [facility])  0.4 MG CAPS capsule Take 0.4 mg by mouth at bedtime. For BPH   Yes Historical Provider, MD  theophylline (THEODUR) 200 MG 12 hr tablet Take 200 mg by mouth 2 (two) times daily. For COPD   Yes Historical Provider, MD  traMADol (ULTRAM) 50 MG tablet Take by mouth every 6 (six) hours as needed.   Yes Historical Provider, MD  warfarin (COUMADIN) 1 MG tablet Take 4 mg by mouth See admin instructions. 4 mg till Tuesday. Then nurse will check level again to give patient new level on coumadin   Yes Historical Provider, MD  acetaminophen (TYLENOL) 500 MG tablet Take 2 tablets (1,000 mg total) by mouth every 8  (eight) hours. Do not take more than 4000 mg of acetaminophen (Tylenol) in a 24-hour period. Please note that other medicines that you may be prescribed may have Tylenol as well. 08/29/16 09/03/16  Nira Conn, MD  ondansetron (ZOFRAN) 4 MG tablet Take 1 tablet (4 mg total) by mouth every 6 (six) hours as needed for nausea or vomiting. 08/29/16 09/01/16  Nira Conn, MD  warfarin (COUMADIN) 2.5 MG tablet Take 2.5 mg by mouth daily. With evening meal.    Historical Provider, MD    Family History Family History  Problem Relation Age of Onset  . Cancer Mother   . Heart disease Father     Social History Social History  Substance Use Topics  . Smoking status: Former Smoker    Packs/day: 0.50    Types: Cigarettes  . Smokeless tobacco: Never Used  . Alcohol use No     Allergies   Review of patient's allergies indicates no known allergies.   Review of Systems Review of Systems Ten systems are reviewed and are negative for acute change except as noted in the HPI   Physical Exam Updated Vital Signs BP 113/63   Pulse 86   Temp 97.5 F (36.4 C) (Oral)   Resp 23   Ht 5\' 10"  (1.778 m)   Wt 192 lb (87.1 kg)   SpO2 97%   BMI 27.55 kg/m   Physical Exam  Constitutional: He is oriented to person, place, and time. He appears well-developed and well-nourished. No distress. Cervical collar in place.  HENT:  Head: Normocephalic. Head is with contusion and with laceration (2 cm laceration to left occiput; hemostatic. no FB).  Right Ear: External ear normal.  Left Ear: External ear normal.  Mouth/Throat: Oropharynx is clear and moist.  Eyes: Conjunctivae and EOM are normal. Pupils are equal, round, and reactive to light. Right eye exhibits no discharge. Left eye exhibits no discharge. No scleral icterus.  Neck: Normal range of motion. Neck supple.  Cardiovascular: Regular rhythm and normal heart sounds.  Exam reveals no gallop and no friction rub.   No murmur  heard. Pulses:      Radial pulses are 2+ on the right side, and 2+ on the left side.       Dorsalis pedis pulses are 2+ on the right side, and 2+ on the left side.  Pulmonary/Chest: Effort normal and breath sounds normal. No stridor. No respiratory distress.  Abdominal: Soft. He exhibits no distension. There is no tenderness.  Musculoskeletal:       Cervical back: He exhibits no bony tenderness.       Thoracic back: He exhibits no bony tenderness.       Lumbar back: He exhibits no bony tenderness.  Clavicle stable. Chest stable to AP/Lat compression. Pelvis stable to Lat  compression. No obvious extremity deformity.   Neurological: He is alert and oriented to person, place, and time. GCS eye subscore is 4. GCS verbal subscore is 5. GCS motor subscore is 6.  Moving all extremities   Skin: Skin is warm. He is not diaphoretic.     ED Treatments / Results  Labs (all labs ordered are listed, but only abnormal results are displayed) Labs Reviewed  CBC WITH DIFFERENTIAL/PLATELET - Abnormal; Notable for the following:       Result Value   WBC 11.6 (*)    RBC 3.84 (*)    Hemoglobin 10.8 (*)    HCT 34.9 (*)    RDW 17.4 (*)    Monocytes Absolute 1.1 (*)    All other components within normal limits  PROTIME-INR - Abnormal; Notable for the following:    Prothrombin Time 18.9 (*)    All other components within normal limits  BASIC METABOLIC PANEL - Abnormal; Notable for the following:    Glucose, Bld 107 (*)    Anion gap 4 (*)    All other components within normal limits    EKG  EKG Interpretation None       Radiology Ct Head Wo Contrast  Result Date: 08/29/2016 CLINICAL DATA:  Fall on Coumadin EXAM: CT HEAD WITHOUT CONTRAST CT CERVICAL SPINE WITHOUT CONTRAST TECHNIQUE: Multidetector CT imaging of the head and cervical spine was performed following the standard protocol without intravenous contrast. Multiplanar CT image reconstructions of the cervical spine were also  generated. COMPARISON:  None. FINDINGS: CT HEAD FINDINGS Brain: No intracranial hemorrhage, mass effect or midline shift. Moderate cerebral atrophy. Mild periventricular white matter decreased attenuation probable due to chronic small vessel ischemic changes. No acute cortical infarction. No mass lesion is noted on this unenhanced scan. Vascular: Atherosclerotic calcifications of carotid siphon. Skull: Normal. Negative for fracture or focal lesion. Sinuses/Orbits: No acute finding. Other: None CT CERVICAL SPINE FINDINGS Alignment: Normal alignment of the cervical spine. Skull base and vertebrae: No acute fracture or subluxation. Degenerative changes C1-C2 articulation. Anterior spurring noted lower endplate of C3 vertebral body. Anterior spurring upper and lower endplate of C4 and C5 vertebral body. Soft tissues and spinal canal: No prevertebral soft tissue swelling. Spinal canal is patent. Disc levels: Mild disc space flattening with anterior spurring at C4-C5 level. Moderate disc space flattening with mild anterior and mild posterior spurring at C5-C6 level. Minimal disc space flattening with posterior spurring at C6-C7 level. Upper chest: There is no pneumothorax in visualized left apex. Other: None IMPRESSION: 1. No acute intracranial abnormality.  Moderate cerebral atrophy. 2. No cervical spine acute fracture or subluxation. Degenerative changes as described above. Electronically Signed   By: Natasha Mead M.D.   On: 08/29/2016 14:25   Ct Cervical Spine Wo Contrast  Result Date: 08/29/2016 CLINICAL DATA:  Fall on Coumadin EXAM: CT HEAD WITHOUT CONTRAST CT CERVICAL SPINE WITHOUT CONTRAST TECHNIQUE: Multidetector CT imaging of the head and cervical spine was performed following the standard protocol without intravenous contrast. Multiplanar CT image reconstructions of the cervical spine were also generated. COMPARISON:  None. FINDINGS: CT HEAD FINDINGS Brain: No intracranial hemorrhage, mass effect or midline  shift. Moderate cerebral atrophy. Mild periventricular white matter decreased attenuation probable due to chronic small vessel ischemic changes. No acute cortical infarction. No mass lesion is noted on this unenhanced scan. Vascular: Atherosclerotic calcifications of carotid siphon. Skull: Normal. Negative for fracture or focal lesion. Sinuses/Orbits: No acute finding. Other: None CT CERVICAL SPINE FINDINGS Alignment: Normal  alignment of the cervical spine. Skull base and vertebrae: No acute fracture or subluxation. Degenerative changes C1-C2 articulation. Anterior spurring noted lower endplate of C3 vertebral body. Anterior spurring upper and lower endplate of C4 and C5 vertebral body. Soft tissues and spinal canal: No prevertebral soft tissue swelling. Spinal canal is patent. Disc levels: Mild disc space flattening with anterior spurring at C4-C5 level. Moderate disc space flattening with mild anterior and mild posterior spurring at C5-C6 level. Minimal disc space flattening with posterior spurring at C6-C7 level. Upper chest: There is no pneumothorax in visualized left apex. Other: None IMPRESSION: 1. No acute intracranial abnormality.  Moderate cerebral atrophy. 2. No cervical spine acute fracture or subluxation. Degenerative changes as described above. Electronically Signed   By: Natasha MeadLiviu  Pop M.D.   On: 08/29/2016 14:25    Procedures .Marland Kitchen.Laceration Repair Date/Time: 08/29/2016 5:36 PM Performed by: Nira ConnARDAMA, Gavina Dildine EDUARDO Authorized by: Nira ConnARDAMA, Arhum Peeples EDUARDO   Consent:    Consent obtained:  Verbal   Risks discussed:  Infection, pain and poor cosmetic result   Alternatives discussed:  No treatment Anesthesia (see MAR for exact dosages):    Anesthesia method:  None Laceration details:    Location:  Scalp   Scalp location:  Occipital   Length (cm):  2 Repair type:    Repair type:  Simple Pre-procedure details:    Preparation:  Patient was prepped and draped in usual sterile fashion and imaging  obtained to evaluate for foreign bodies Exploration:    Wound exploration: entire depth of wound probed and visualized     Contaminated: no   Treatment:    Area cleansed with:  Betadine   Amount of cleaning:  Extensive   Irrigation solution:  Sterile saline   Irrigation volume:  500   Irrigation method:  Syringe Skin repair:    Repair method:  Staples   Number of staples:  1 Approximation:    Approximation:  Close Post-procedure details:    Dressing:  Antibiotic ointment   Patient tolerance of procedure:  Tolerated well, no immediate complications   (including critical care time)  Medications Ordered in ED Medications  ondansetron (ZOFRAN-ODT) disintegrating tablet 4 mg (4 mg Oral Given 08/29/16 1650)     Initial Impression / Assessment and Plan / ED Course  I have reviewed the triage vital signs and the nursing notes.  Pertinent labs & imaging results that were available during my care of the patient were reviewed by me and considered in my medical decision making (see chart for details).  Clinical Course   Family patient is intermittently confused at baseline however here he is oriented to person, place, and time. CT head without intracranial bleed. CT cervical spine negative. INR subtherapeutic at 1.57. Provided with antiemetics. Laceration thoroughly irrigated and closed with 1 staple. Patient able to ambulate with assistance.   Safe for discharge with strict return precautions.   Final Clinical Impressions(s) / ED Diagnoses   Final diagnoses:  Minor head injury, initial encounter  Contusion of scalp, initial encounter  Laceration of scalp, initial encounter  Concussion with loss of consciousness of 30 minutes or less, initial encounter   Disposition: Discharge  Condition: Good  I have discussed the results, Dx and Tx plan with the patient who expressed understanding and agree(s) with the plan. Discharge instructions discussed at great length. The patient was  given strict return precautions who verbalized understanding of the instructions. No further questions at time of discharge.    New Prescriptions   ACETAMINOPHEN (  TYLENOL) 500 MG TABLET    Take 2 tablets (1,000 mg total) by mouth every 8 (eight) hours. Do not take more than 4000 mg of acetaminophen (Tylenol) in a 24-hour period. Please note that other medicines that you may be prescribed may have Tylenol as well.   ONDANSETRON (ZOFRAN) 4 MG TABLET    Take 1 tablet (4 mg total) by mouth every 6 (six) hours as needed for nausea or vomiting.    Follow Up: Primary care provider  Schedule an appointment as soon as possible for a visit  in 5-7 days For suture removal. Or come to the ED if they are not able to do so.      Nira Conn, MD 08/29/16 [phone]

## 2016-08-29 NOTE — ED Triage Notes (Signed)
Pt is confused when asking him questions

## 2016-08-29 NOTE — ED Notes (Signed)
Pt arrives awake and alert though very [place] with any movement pt responds appropriately at times to questions though other times appears confused

## 2016-08-29 NOTE — ED Notes (Signed)
MD at bedside. 

## 2016-08-29 NOTE — ED Triage Notes (Signed)
Pt is on blood thinners, arrives nauseas and vomiting, pt c/o pain to back of head

## 2018-04-20 IMAGING — CT CT HEAD W/O CM
3 of 7 series · 16 of 47 positions shown, 19 images · non-contrast
Comparison: None.

CLINICAL DATA: Fall on Coumadin

EXAM:
CT HEAD WITHOUT CONTRAST
CT CERVICAL SPINE WITHOUT CONTRAST
TECHNIQUE: Multidetector CT imaging of the head and cervical spine was
performed following the standard protocol without intravenous
contrast. Multiplanar CT image reconstructions of the cervical spine
were also generated.

[Series 203: coronal st, idose (1) · coronal · 0.40mm/px · 3 of 70 slices shown]
[im 18/70  brain]
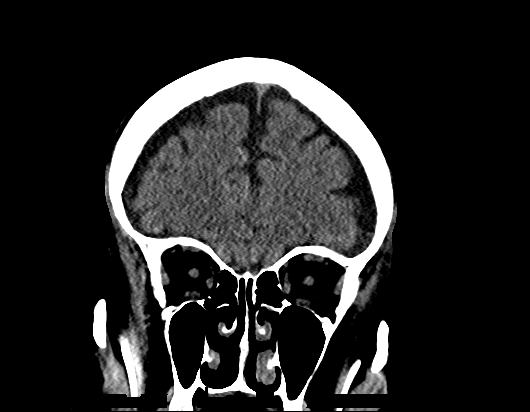
[im 35/70  brain]
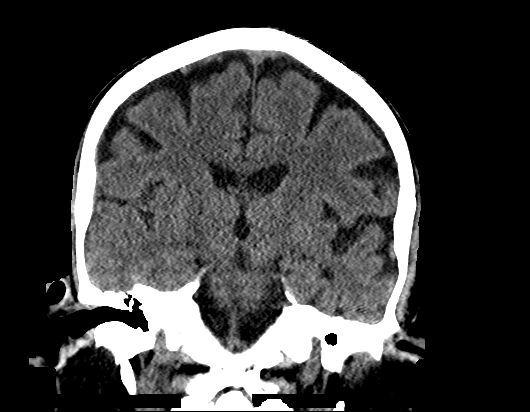
[im 52/70  brain]
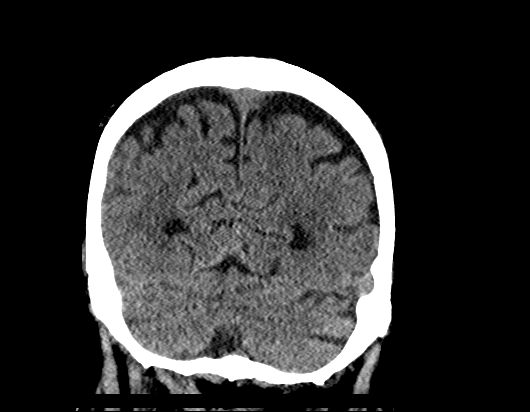

[Series 302: soft tissue, idose (2) · axial · 0.41mm/px · z∈[-78,+84]mm · 11 of 95 slices shown, 14 images]
[im 7/95  brain]
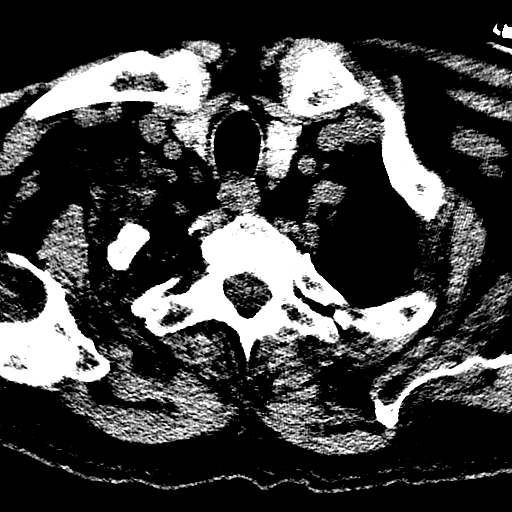
[im 7/95  bone]
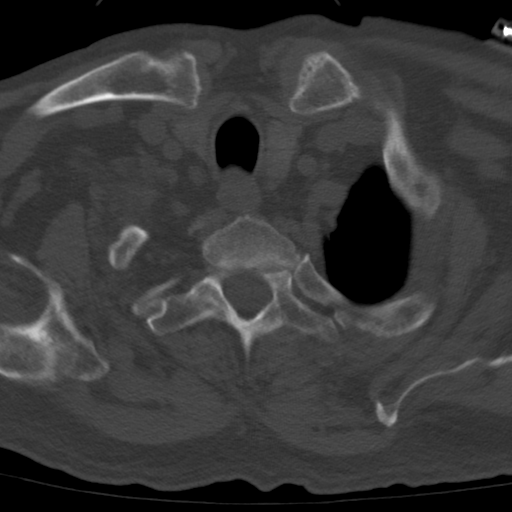
[im 14/95  brain]
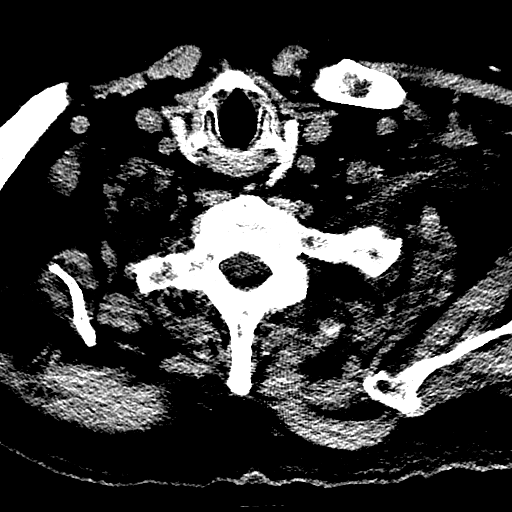
[im 21/95  brain]
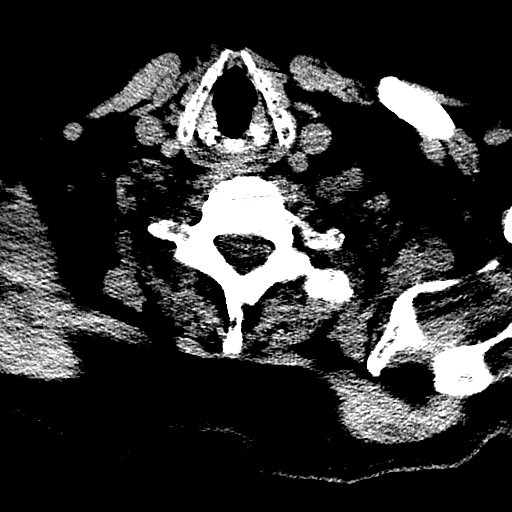
[im 34/95  brain]
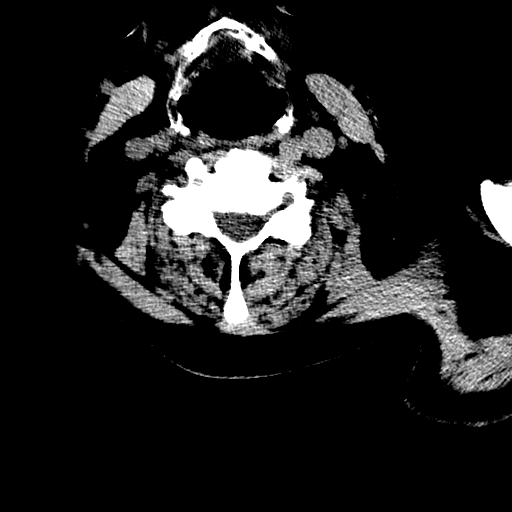
[im 41/95  brain]
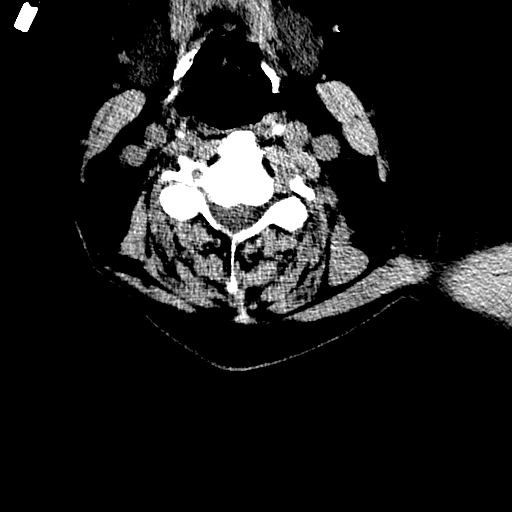
[im 41/95  bone]
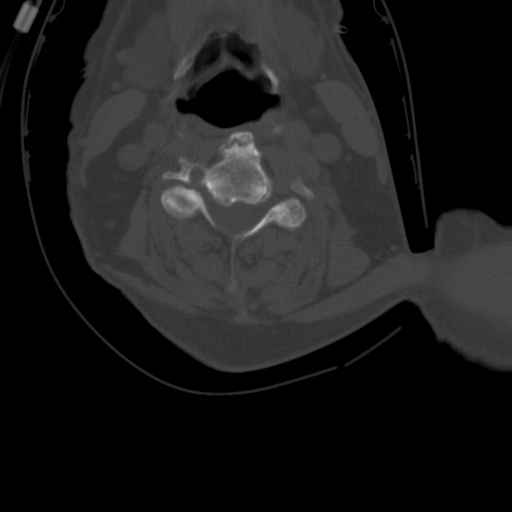
[im 48/95  brain]
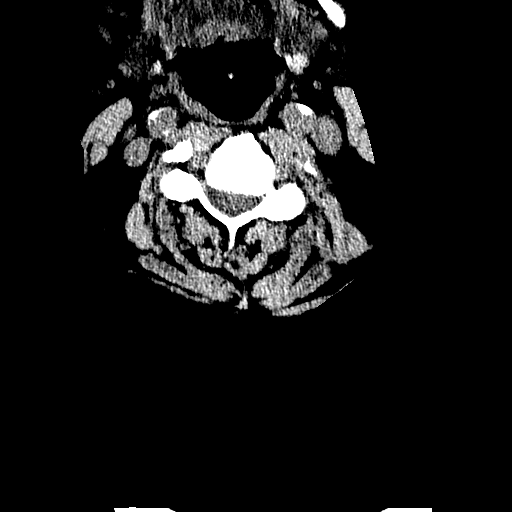
[im 54/95  brain]
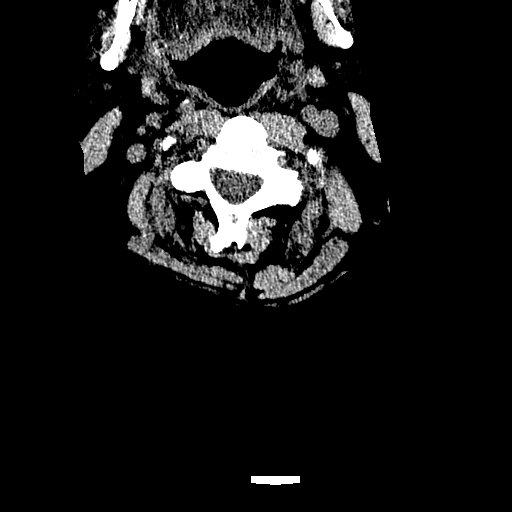
[im 61/95  brain]
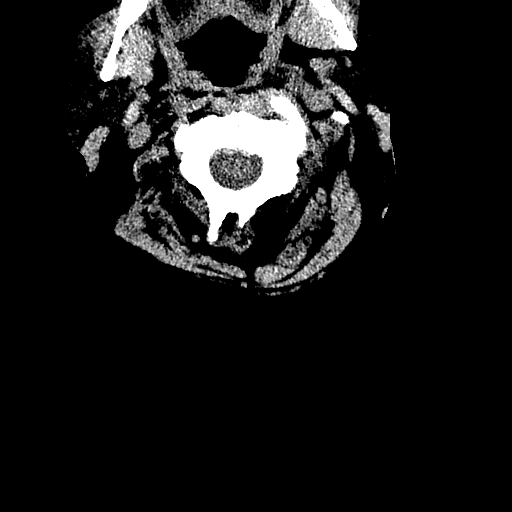
[im 74/95  brain]
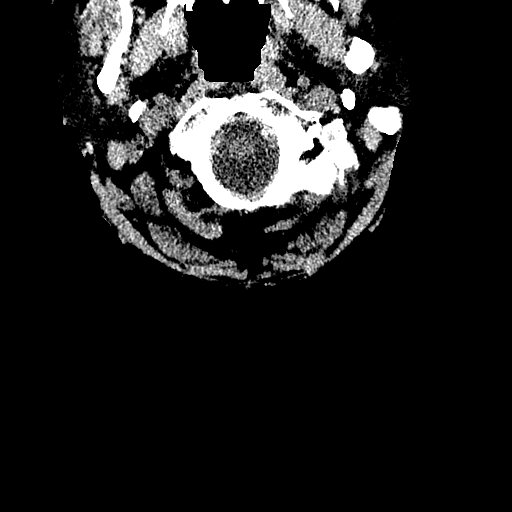
[im 74/95  bone]
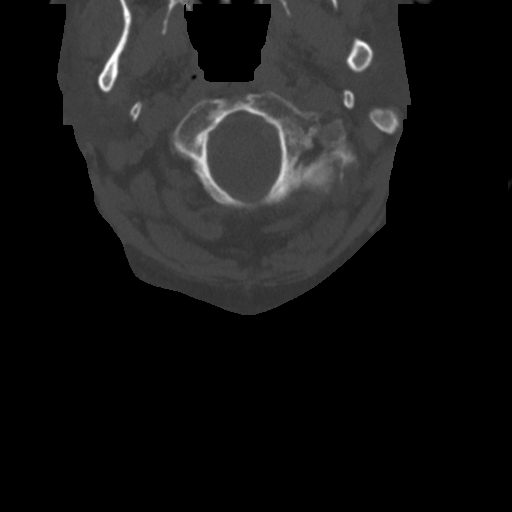
[im 81/95  brain]
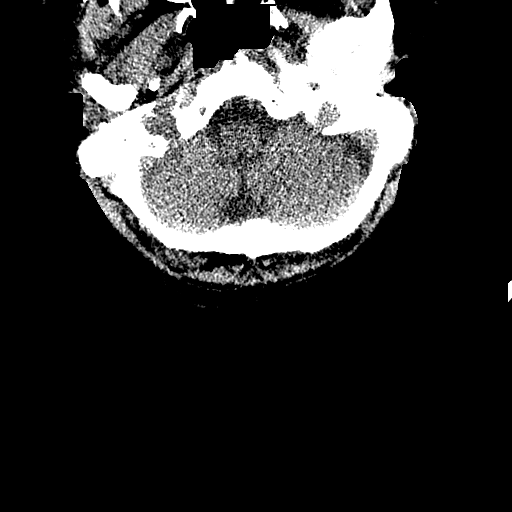
[im 88/95  brain]
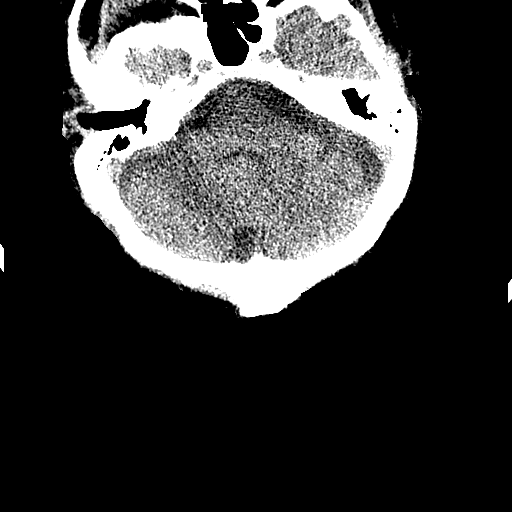

[Series 305: sagittal, idose (2) · sagittal · 0.34mm/px · 2 of 80 slices shown]
[im 27/80  brain]
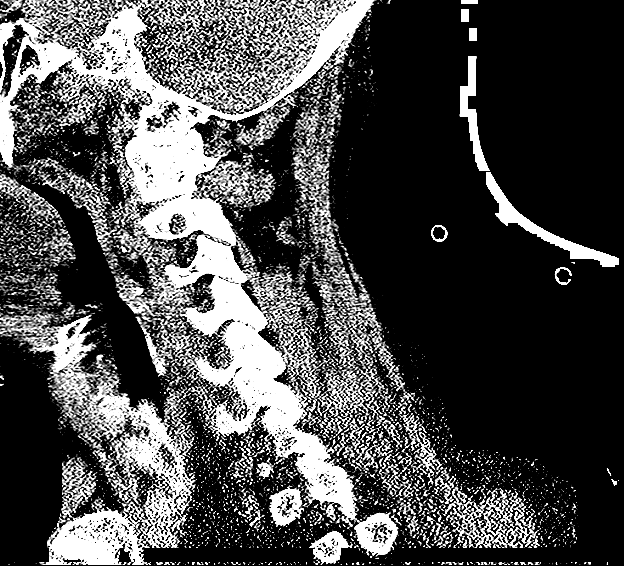
[im 53/80  brain]
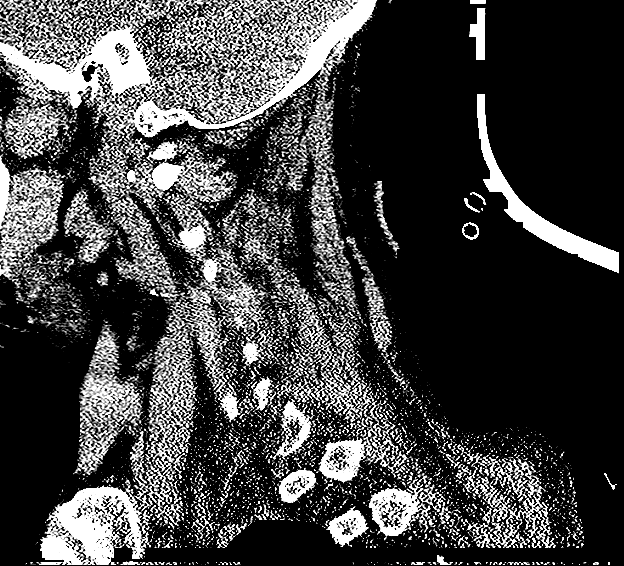

[16 of 47 positions shown; findings below may reference images not displayed]

FINDINGS: CT HEAD FINDINGS

Brain: No intracranial hemorrhage, mass effect or midline shift.
Moderate cerebral atrophy. Mild periventricular white matter
decreased attenuation probable due to chronic small vessel ischemic
changes. No acute cortical infarction. No mass lesion is noted on
this unenhanced scan.

Vascular: Atherosclerotic calcifications of carotid siphon.

Skull: Normal. Negative for fracture or focal lesion.

Sinuses/Orbits: No acute finding.

Other: None

CT CERVICAL SPINE FINDINGS

Alignment: Normal alignment of the cervical spine.

Skull base and vertebrae: No acute fracture or subluxation.
Degenerative changes C1-C2 articulation. Anterior spurring noted
lower endplate of C3 vertebral body. Anterior spurring upper and
lower endplate of C4 and C5 vertebral body.

Soft tissues and spinal canal: No prevertebral soft tissue swelling.
Spinal canal is patent.

Disc levels: Mild disc space flattening with anterior spurring at
C4-C5 level. Moderate disc space flattening with mild anterior and
mild posterior spurring at C5-C6 level. Minimal disc space
flattening with posterior spurring at C6-C7 level.

Upper chest: There is no pneumothorax in visualized left apex.

Other: None
IMPRESSION: 1. No acute intracranial abnormality.  Moderate cerebral atrophy.
2. No cervical spine acute fracture or subluxation. Degenerative
changes as described above.

## 2019-08-09 DEATH — deceased
